# Patient Record
Sex: Male | Born: 2000 | Race: White | Hispanic: No | Marital: Single
Health system: Southern US, Community
[De-identification: ages and names within clinical notes are randomized; demographics above are authoritative.]

## PROBLEM LIST (undated history)

## (undated) DIAGNOSIS — R569 Unspecified convulsions: Secondary | ICD-10-CM

## (undated) DIAGNOSIS — J45909 Unspecified asthma, uncomplicated: Secondary | ICD-10-CM

## (undated) HISTORY — PX: CIRCUMCISION: SUR203

---
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
## 2021-01-11 ENCOUNTER — Ambulatory Visit (INDEPENDENT_AMBULATORY_CARE_PROVIDER_SITE_OTHER): Payer: Medicaid Other | Admitting: Pediatrics

## 2021-04-25 ENCOUNTER — Encounter (INDEPENDENT_AMBULATORY_CARE_PROVIDER_SITE_OTHER): Payer: Self-pay

## 2021-05-06 ENCOUNTER — Telehealth (INDEPENDENT_AMBULATORY_CARE_PROVIDER_SITE_OTHER): Payer: Self-pay | Admitting: Pediatrics

## 2021-05-06 DIAGNOSIS — R569 Unspecified convulsions: Secondary | ICD-10-CM

## 2021-05-06 DIAGNOSIS — R519 Headache, unspecified: Secondary | ICD-10-CM

## 2021-05-06 MED ORDER — LEVETIRACETAM ER 500 MG PO TB24: ORAL_TABLET | ORAL | 3 refills | Status: AC

## 2021-05-06 NOTE — Telephone Encounter (Signed)
Who's calling (name and relationship to patient) : Cameron Mercado mom   Best contact number: [phone]  Provider they see: Dr. Artis Flock  Reason for call:   Call ID:      PRESCRIPTION REFILL ONLY  Name of prescription: Mom thinks medication is keppra  Pharmacy: [facility] [address]

## 2021-05-06 NOTE — Telephone Encounter (Signed)
Medication sent with 3 refills. Called Press and lvm letting him know refill was sent to pharmacy.

## 2021-06-17 ENCOUNTER — Ambulatory Visit (INDEPENDENT_AMBULATORY_CARE_PROVIDER_SITE_OTHER): Payer: Medicaid Other | Admitting: Pediatrics

## 2024-03-02 ENCOUNTER — Telehealth (INDEPENDENT_AMBULATORY_CARE_PROVIDER_SITE_OTHER): Payer: Self-pay | Admitting: Pediatrics

## 2024-03-02 NOTE — Telephone Encounter (Addendum)
 Contacted Dent.  Verified his DOB.   Montrice insisted that we place an order for him to receive an EEG. I informed Christon that because we haven't seen him in over 2 years and his age, he would need to be seen by his PCP.   Jahmel stated that he did not have one, I informed him that he would need to find one. Elon proceeded to tell me to "fuck off" and ended the call.   SS, CCMA

## 2024-03-02 NOTE — Telephone Encounter (Signed)
  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 He is aware he is no longer a patient of the practice but insisted the request be made regardless.

## 2024-04-25 ENCOUNTER — Encounter: Payer: Self-pay | Admitting: Neurology

## 2024-07-06 ENCOUNTER — Ambulatory Visit: Payer: Self-pay | Admitting: Neurology

## 2024-09-09 ENCOUNTER — Ambulatory Visit: Admitting: Neurology

## 2024-09-09 ENCOUNTER — Encounter: Payer: Self-pay | Admitting: Neurology
